# Patient Record
Sex: Female | Born: 1940 | Race: Black or African American | Hispanic: No | Smoking: Former smoker
Health system: Southern US, Community
[De-identification: ages and names within clinical notes are randomized; demographics above are authoritative.]

## PROBLEM LIST (undated history)

## (undated) DIAGNOSIS — I10 Essential (primary) hypertension: Secondary | ICD-10-CM

## (undated) HISTORY — PX: ABDOMINAL HYSTERECTOMY: SHX81

---

## 2021-02-21 ENCOUNTER — Other Ambulatory Visit: Payer: Self-pay | Admitting: Internal Medicine

## 2021-02-21 DIAGNOSIS — Z1231 Encounter for screening mammogram for malignant neoplasm of breast: Secondary | ICD-10-CM

## 2021-03-04 ENCOUNTER — Inpatient Hospital Stay
Admission: RE | Admit: 2021-03-04 | Discharge: 2021-03-04 | Disposition: A | Discharge status: Home / Self Care | Payer: Self-pay | Source: Ambulatory Visit | Attending: *Deleted | Admitting: *Deleted

## 2021-03-04 ENCOUNTER — Other Ambulatory Visit: Payer: Self-pay

## 2021-03-04 ENCOUNTER — Ambulatory Visit
Admission: RE | Admit: 2021-03-04 | Discharge: 2021-03-04 | Disposition: A | Discharge status: Home / Self Care | Payer: Medicare HMO | Source: Ambulatory Visit | Attending: Internal Medicine | Admitting: Internal Medicine

## 2021-03-04 ENCOUNTER — Other Ambulatory Visit: Payer: Self-pay | Admitting: *Deleted

## 2021-03-04 DIAGNOSIS — Z1231 Encounter for screening mammogram for malignant neoplasm of breast: Secondary | ICD-10-CM | POA: Insufficient documentation

## 2021-03-09 ENCOUNTER — Other Ambulatory Visit: Payer: Self-pay | Admitting: Internal Medicine

## 2021-03-09 DIAGNOSIS — R928 Other abnormal and inconclusive findings on diagnostic imaging of breast: Secondary | ICD-10-CM

## 2021-03-09 DIAGNOSIS — N6310 Unspecified lump in the right breast, unspecified quadrant: Secondary | ICD-10-CM

## 2021-03-14 ENCOUNTER — Other Ambulatory Visit: Payer: Self-pay

## 2021-03-14 ENCOUNTER — Ambulatory Visit
Admission: RE | Admit: 2021-03-14 | Discharge: 2021-03-14 | Disposition: A | Discharge status: Home / Self Care | Payer: Medicare HMO | Source: Ambulatory Visit | Attending: Internal Medicine | Admitting: Internal Medicine

## 2021-03-14 DIAGNOSIS — R928 Other abnormal and inconclusive findings on diagnostic imaging of breast: Secondary | ICD-10-CM | POA: Diagnosis present

## 2021-03-14 DIAGNOSIS — N6310 Unspecified lump in the right breast, unspecified quadrant: Secondary | ICD-10-CM | POA: Insufficient documentation

## 2021-04-06 ENCOUNTER — Telehealth: Payer: Self-pay | Admitting: Acute Care

## 2021-04-11 ENCOUNTER — Telehealth: Payer: Self-pay | Admitting: Acute Care

## 2021-04-11 DIAGNOSIS — F17210 Nicotine dependence, cigarettes, uncomplicated: Secondary | ICD-10-CM

## 2021-04-11 DIAGNOSIS — Z87891 Personal history of nicotine dependence: Secondary | ICD-10-CM

## 2021-04-11 NOTE — Telephone Encounter (Signed)
Spoke with Junious Dresser at Dr Prudencio Pair office. I advised her that I will call pt to discuss lung cancer screening. I also advised that Medicare may block this due to pt's current age of 39. I told her I would call her back and let her know if I have trouble getting pt scheduled. Will close this message and refer to referral notes.

## 2021-04-12 NOTE — Telephone Encounter (Signed)
Spoke with patient Scheduled [facility] 05/11/21 10:30 CT order was placed Pt voiced understanding and had no further questions.

## 2021-05-11 ENCOUNTER — Encounter: Payer: Medicare HMO | Admitting: Acute Care

## 2021-05-11 ENCOUNTER — Ambulatory Visit: Payer: Medicare HMO

## 2022-03-09 ENCOUNTER — Other Ambulatory Visit: Payer: Self-pay | Admitting: Internal Medicine

## 2022-03-09 DIAGNOSIS — Z1231 Encounter for screening mammogram for malignant neoplasm of breast: Secondary | ICD-10-CM

## 2022-04-06 ENCOUNTER — Ambulatory Visit
Admission: RE | Admit: 2022-04-06 | Discharge: 2022-04-06 | Disposition: A | Discharge status: Home / Self Care | Payer: Medicare HMO | Source: Ambulatory Visit | Attending: Internal Medicine | Admitting: Internal Medicine

## 2022-04-06 DIAGNOSIS — Z1231 Encounter for screening mammogram for malignant neoplasm of breast: Secondary | ICD-10-CM | POA: Diagnosis not present

## 2022-04-29 ENCOUNTER — Other Ambulatory Visit: Payer: Self-pay

## 2022-04-29 ENCOUNTER — Emergency Department
Admission: EM | Admit: 2022-04-29 | Discharge: 2022-04-29 | Disposition: A | Discharge status: Home / Self Care | Payer: Medicare HMO | Attending: Emergency Medicine | Admitting: Emergency Medicine

## 2022-04-29 DIAGNOSIS — M545 Low back pain, unspecified: Secondary | ICD-10-CM | POA: Diagnosis present

## 2022-04-29 DIAGNOSIS — G8929 Other chronic pain: Secondary | ICD-10-CM

## 2022-04-29 HISTORY — DX: Essential (primary) hypertension: I10

## 2022-04-29 MED ORDER — METHOCARBAMOL 500 MG PO TABS
750.0000 mg | ORAL_TABLET | Freq: Once | ORAL | Status: AC
  Administered 2022-04-29: 750 mg via ORAL
  Filled 2022-04-29: qty 2

## 2022-04-29 MED ORDER — METHOCARBAMOL 750 MG PO TABS: 750.0000 mg | ORAL_TABLET | Freq: Three times a day (TID) | ORAL | 0 refills | Status: AC

## 2022-04-29 MED ORDER — KETOROLAC TROMETHAMINE 15 MG/ML IJ SOLN
15.0000 mg | Freq: Once | INTRAMUSCULAR | Status: AC
  Administered 2022-04-29: 15 mg via INTRAMUSCULAR
  Filled 2022-04-29: qty 1

## 2022-04-29 MED ORDER — ACETAMINOPHEN 325 MG PO TABS
650.0000 mg | ORAL_TABLET | Freq: Once | ORAL | Status: AC
  Administered 2022-04-29: 650 mg via ORAL
  Filled 2022-04-29: qty 2

## 2022-04-29 MED ORDER — LIDOCAINE 5 % EX PTCH
1.0000 | MEDICATED_PATCH | CUTANEOUS | Status: DC
  Administered 2022-04-29: 1 via TRANSDERMAL
  Filled 2022-04-29: qty 1

## 2022-04-29 MED ORDER — LIDOCAINE 5 % EX PTCH: 1.0000 | MEDICATED_PATCH | CUTANEOUS | 0 refills | Status: AC

## 2022-04-29 NOTE — ED Triage Notes (Signed)
Pt presents to ED with c/o of low back pain. Pt denies injury or trauma. Pt states HX of lower back issues. Pt denies any usual loss of bowel or bladder.   Pt states she used to get cortisone shots for low back pain.

## 2022-04-29 NOTE — ED Provider Notes (Signed)
[facility] Provider Note    Event Date/Time   First MD Initiated Contact with Patient 04/29/22 1321     (approximate)   History   Back Pain   HPI  Monique Franco Croucher is a 81 y.o. female   This is a patient who is a pleasant 81 year old female who presents with her daughter, patient has a history of chronic back pain on the left lower side and will get occasional steroid injections for pain control.  She has moved from [place] and has not had an injection for quite some time.  Her pain is the same as it always has been it is acting up in the left side.  No trauma.  No incontinence or motor or sensory deficits.  No radiation of pain.  No abdominal pain.  Otherwise been in her regular state of health. History was obtained via patient and her daughter who is at bedside      Physical Exam   Triage Vital Signs: ED Triage Vitals  Enc Vitals Group     BP 04/29/22 1132 (!) 154/80     Pulse Rate 04/29/22 1132 (!) 55     Resp 04/29/22 1132 18     Temp 04/29/22 1132 98.6 F (37 C)     Temp Source 04/29/22 1132 Oral     SpO2 04/29/22 1132 100 %     Weight --      Height --      Head Circumference --      Peak Flow --      Pain Score 04/29/22 1133 10     Pain Loc --      Pain Edu? --      Excl. in GC? --     Most recent vital signs: Vitals:   04/29/22 1132 04/29/22 1544  BP: (!) 154/80 (!) 150/75  Pulse: (!) 55 60  Resp: 18 18  Temp: 98.6 F (37 C) 98.5 F (36.9 C)  SpO2: 100% 97%    General: Awake, no distress.  CV:  Good peripheral perfusion.  Resp:  Normal effort.  Abd:  No distention.  Soft and nontender Other:  Mild tenderness to palpation to the left lower back paraspinal area.  Motor or sensory intact to lower extremities bilaterally.   ED Results / Procedures / Treatments    PROCEDURES:  Critical Care performed: No  Procedures   MEDICATIONS ORDERED IN ED: Medications  lidocaine (LIDODERM) 5 % 1 patch (1 patch  Transdermal Patch Applied 04/29/22 1448)  ketorolac (TORADOL) 15 MG/ML injection 15 mg (15 mg Intramuscular Given 04/29/22 1447)  acetaminophen (TYLENOL) tablet 650 mg (650 mg Oral Given 04/29/22 1447)  methocarbamol (ROBAXIN) tablet 750 mg (750 mg Oral Given 04/29/22 1448)     IMPRESSION / MDM / ASSESSMENT AND PLAN / ED COURSE  I reviewed the triage vital signs and the nursing notes.                              Differential diagnosis includes, but is not limited to, chronic back pain, muscle strain, considered but I think is less likely emergent pathology like spinal cord compression no red flag symptoms, fracture or dislocation given no trauma, kidney stones given no history of the above and symptoms consistent with known back pain.  In shared decision-making with the patient and her daughter, we will defer imaging given low likelihood of fracture dislocation or other acute emergent pathology.  Plan for pain control and referral to PMD and possible Ortho for ongoing injections given this patient has [place].  Dispo: After careful consideration of this patient's presentation, medical and social risk factors, and evaluation in the emergency department I engaged in shared decision making with the patient and/or their representative to consider admission or observation and this patient was ultimately discharged because pt has good social support and her daughter who lives nearby, this is an exacerbation of chronic problem, and she is in stable condition with plan for follow-up with PMD and pain control as above..   Patient's presentation is most consistent with exacerbation of chronic illness.       FINAL CLINICAL IMPRESSION(S) / ED DIAGNOSES   Final diagnoses:  Chronic left-sided low back pain without sciatica     Rx / DC Orders   ED Discharge Orders          Ordered    methocarbamol (ROBAXIN-750) 750 MG tablet  3 times daily        04/29/22 1503    lidocaine  (LIDODERM) 5 %  Every 24 hours        04/29/22 1503             Note:  This document was prepared using Dragon voice recognition software and may include unintentional dictation errors.    Pilar Jarvis, MD 04/29/22 1946

## 2022-04-29 NOTE — Discharge Instructions (Signed)
Take acetaminophen 650 mg and ibuprofen 400 mg every 6 hours for pain.  Take with food. Take robaxin as prescribed. Be careful as this may make you sleepy.  Use pain patches daily.    Thank you for choosing [place] for your health care today!  Please see your primary doctor this week for a follow up appointment.   Sometimes, in the early stages of certain disease courses it is difficult to detect in the emergency department evaluation -- so, it is important that you continue to monitor your symptoms and call your doctor right away or return to the emergency department if you develop any new or worsening symptoms.  It was my pleasure to care for you today.   Daneil Dan Modesto Charon, MD

## 2022-08-09 ENCOUNTER — Other Ambulatory Visit: Payer: Self-pay | Admitting: Family Medicine

## 2022-08-09 DIAGNOSIS — M5416 Radiculopathy, lumbar region: Secondary | ICD-10-CM

## 2022-08-29 ENCOUNTER — Ambulatory Visit
Admission: RE | Admit: 2022-08-29 | Discharge: 2022-08-29 | Disposition: A | Discharge status: Home / Self Care | Payer: Medicare HMO | Source: Ambulatory Visit | Attending: Family Medicine | Admitting: Family Medicine

## 2022-08-29 DIAGNOSIS — M5416 Radiculopathy, lumbar region: Secondary | ICD-10-CM

## 2022-08-31 ENCOUNTER — Ambulatory Visit
Admission: RE | Admit: 2022-08-31 | Discharge: 2022-08-31 | Disposition: A | Discharge status: Home / Self Care | Payer: Medicare HMO | Source: Ambulatory Visit | Attending: Physical Medicine and Rehabilitation | Admitting: Physical Medicine and Rehabilitation

## 2022-08-31 ENCOUNTER — Other Ambulatory Visit: Payer: Self-pay | Admitting: Physical Medicine and Rehabilitation

## 2022-08-31 DIAGNOSIS — R1900 Intra-abdominal and pelvic swelling, mass and lump, unspecified site: Secondary | ICD-10-CM

## 2022-08-31 MED ORDER — IOHEXOL 300 MG/ML  SOLN
100.0000 mL | Freq: Once | INTRAMUSCULAR | Status: AC | PRN
  Administered 2022-08-31: 100 mL via INTRAVENOUS

## 2022-09-06 ENCOUNTER — Other Ambulatory Visit: Payer: Self-pay | Admitting: Family Medicine

## 2022-09-06 DIAGNOSIS — N9489 Other specified conditions associated with female genital organs and menstrual cycle: Secondary | ICD-10-CM

## 2022-09-06 DIAGNOSIS — R1900 Intra-abdominal and pelvic swelling, mass and lump, unspecified site: Secondary | ICD-10-CM

## 2022-09-07 ENCOUNTER — Ambulatory Visit
Admission: RE | Admit: 2022-09-07 | Discharge: 2022-09-07 | Disposition: A | Discharge status: Home / Self Care | Payer: Medicare HMO | Source: Ambulatory Visit | Attending: Family Medicine | Admitting: Family Medicine

## 2022-09-07 DIAGNOSIS — R1900 Intra-abdominal and pelvic swelling, mass and lump, unspecified site: Secondary | ICD-10-CM | POA: Diagnosis present

## 2022-09-07 DIAGNOSIS — N9489 Other specified conditions associated with female genital organs and menstrual cycle: Secondary | ICD-10-CM | POA: Diagnosis present

## 2022-09-07 MED ORDER — GADOBUTROL 1 MMOL/ML IV SOLN
7.0000 mL | Freq: Once | INTRAVENOUS | Status: AC | PRN
  Administered 2022-09-07: 7 mL via INTRAVENOUS

## 2022-10-09 ENCOUNTER — Other Ambulatory Visit (INDEPENDENT_AMBULATORY_CARE_PROVIDER_SITE_OTHER): Payer: Self-pay | Admitting: Podiatry

## 2022-10-09 ENCOUNTER — Ambulatory Visit (INDEPENDENT_AMBULATORY_CARE_PROVIDER_SITE_OTHER): Payer: Medicare HMO

## 2022-10-09 DIAGNOSIS — I739 Peripheral vascular disease, unspecified: Secondary | ICD-10-CM

## 2022-10-09 LAB — VAS US ABI WITH/WO TBI
Left ABI: 0.4
Right ABI: 0.58

## 2022-10-30 ENCOUNTER — Encounter (INDEPENDENT_AMBULATORY_CARE_PROVIDER_SITE_OTHER): Payer: Self-pay | Admitting: Vascular Surgery

## 2022-10-30 ENCOUNTER — Ambulatory Visit (INDEPENDENT_AMBULATORY_CARE_PROVIDER_SITE_OTHER): Payer: Medicare HMO | Admitting: Vascular Surgery

## 2022-10-30 VITALS — BP 165/82 | HR 54 | Resp 18 | Ht 61.0 in | Wt 148.8 lb

## 2022-10-30 DIAGNOSIS — I70213 Atherosclerosis of native arteries of extremities with intermittent claudication, bilateral legs: Secondary | ICD-10-CM

## 2022-10-30 DIAGNOSIS — I10 Essential (primary) hypertension: Secondary | ICD-10-CM

## 2022-10-30 DIAGNOSIS — M159 Polyosteoarthritis, unspecified: Secondary | ICD-10-CM | POA: Diagnosis not present

## 2022-10-30 DIAGNOSIS — I70219 Atherosclerosis of native arteries of extremities with intermittent claudication, unspecified extremity: Secondary | ICD-10-CM | POA: Insufficient documentation

## 2022-10-30 DIAGNOSIS — E782 Mixed hyperlipidemia: Secondary | ICD-10-CM

## 2022-10-30 NOTE — Progress Notes (Signed)
MRN : [id number]  Monique Franco is a 82 y.o. ([birth year]) female who presents with chief complaint of check circulation.  History of Present Illness:    The patient is seen for evaluation of painful lower extremities and diminished pulses. Patient notes the pain is always associated with activity and is very consistent day today. Typically, the pain occurs at less than one block, progress is as activity continues to the point that the patient must stop walking. Resting including standing still for several minutes allows the patient to walk a similar distance before being forced to stop again. Uneven terrain and inclines shorten the distance. The pain has been progressive over the past several years. The patient denies any abrupt changes in claudication symptoms.  The patient states the inability to walk is causing problems with daily activities.  The patient denies rest pain or dangling of an extremity off the side of the bed during the night for relief. No open wounds or sores at this time. No prior interventions or surgeries.  No history of back problems or DJD of the lumbar sacral spine.   The patient's blood pressure has been stable and relatively well controlled. The patient denies amaurosis fugax or recent TIA symptoms. There are no recent neurological changes noted. The patient denies history of DVT, PE or superficial thrombophlebitis. The patient denies recent episodes of angina or shortness of breath.   ABI done here 10/09/2022 shows Rt=0.58 and Lt=0.40 monophasic signals bilaterally.  No outpatient medications have been marked as taking for the 10/30/22 encounter (Appointment) with Delana Meyer, Dolores Lory, MD.    Past Medical History:  Diagnosis Date   Hypertension     No past surgical history on file.  Social History    Family History Family History  Problem Relation Age of Onset   Breast cancer Neg Hx     No Known Allergies   REVIEW OF SYSTEMS  (Negative unless checked)  Constitutional: '[]'$ Weight loss  '[]'$ Fever  '[]'$ Chills Cardiac: '[]'$ Chest pain   '[]'$ Chest pressure   '[]'$ Palpitations   '[]'$ Shortness of breath when laying flat   '[]'$ Shortness of breath with exertion. Vascular:  '[x]'$ Pain in legs with walking   '[]'$ Pain in legs at rest  '[]'$ History of DVT   '[]'$ Phlebitis   '[]'$ Swelling in legs   '[]'$ Varicose veins   '[]'$ Non-healing ulcers Pulmonary:   '[]'$ Uses home oxygen   '[]'$ Productive cough   '[]'$ Hemoptysis   '[]'$ Wheeze  '[]'$ COPD   '[]'$ Asthma Neurologic:  '[]'$ Dizziness   '[]'$ Seizures   '[]'$ History of stroke   '[]'$ History of TIA  '[]'$ Aphasia   '[]'$ Vissual changes   '[]'$ Weakness or numbness in arm   '[]'$ Weakness or numbness in leg Musculoskeletal:   '[]'$ Joint swelling   '[]'$ Joint pain   '[]'$ Low back pain Hematologic:  '[]'$ Easy bruising  '[]'$ Easy bleeding   '[]'$ Hypercoagulable state   '[]'$ Anemic Gastrointestinal:  '[]'$ Diarrhea   '[]'$ Vomiting  '[]'$ Gastroesophageal reflux/heartburn   '[]'$ Difficulty swallowing. Genitourinary:  '[]'$ Chronic kidney disease   '[]'$ Difficult urination  '[]'$ Frequent urination   '[]'$ Blood in urine Skin:  '[]'$ Rashes   '[]'$ Ulcers  Psychological:  '[]'$ History of anxiety   '[]'$  History of major depression.  Physical Examination  There were no vitals filed for this visit. There is no height or weight on file to calculate BMI. Gen: WD/WN, NAD Head: [place]/AT, No temporalis wasting.  Ear/Nose/Throat: Hearing grossly intact, nares w/o erythema or drainage Eyes: PER, EOMI, sclera nonicteric.  Neck: Supple, no masses.  No bruit  or JVD.  Pulmonary:  Good air movement, no audible wheezing, no use of accessory muscles.  Cardiac: RRR, normal S1, S2, no Murmurs. Vascular:  mild trophic changes, no open wounds Vessel Right Left  Radial Palpable Palpable  PT Not Palpable Not Palpable  DP Not Palpable Not Palpable  Gastrointestinal: soft, non-distended. No guarding/no peritoneal signs.  Musculoskeletal: M/S 5/5 throughout.  No visible deformity.  Neurologic: CN 2-12 intact. Pain and light touch intact in  extremities.  Symmetrical.  Speech is fluent. Motor exam as listed above. Psychiatric: Judgment intact, Mood & affect appropriate for pt's clinical situation. Dermatologic: No rashes or ulcers noted.  No changes consistent with cellulitis.   CBC No results found for: "WBC", "HGB", "HCT", "MCV", "PLT"  BMET No results found for: "NA", "K", "CL", "CO2", "GLUCOSE", "BUN", "CREATININE", "CALCIUM", "GFRNONAA", "GFRAA" CrCl cannot be calculated (No successful lab value found.).  COAG No results found for: "INR", "PROTIME"  Radiology VAS [place] ABI WITH/WO TBI  Result Date: 10/09/2022  LOWER EXTREMITY DOPPLER STUDY Patient Name:  Monique Franco  Date of Exam:   10/09/2022 Medical Rec #: [id number]           Accession #:    [id number] Date of Birth: 1941/06/27            Patient Gender: F Patient Age:   54 years Exam Location:  [facility] Procedure:      VAS [place] ABI WITH/WO TBI Referring Phys: Caroline More --------------------------------------------------------------------------------  Indications: Decreased pulses felt at the level of the Ankle.  Performing Technologist: Almira Coaster RVS  Examination Guidelines: A complete evaluation includes at minimum, Doppler waveform signals and systolic blood pressure reading at the level of bilateral brachial, anterior tibial, and posterior tibial arteries, when vessel segments are accessible. Bilateral testing is considered an integral part of a complete examination. Photoelectric Plethysmograph (PPG) waveforms and toe systolic pressure readings are included as required and additional duplex testing as needed. Limited examinations for reoccurring indications may be performed as noted.  ABI Findings: +---------+------------------+-----+-------------------+---------------+ Right    Rt Pressure (mmHg)IndexWaveform           Comment         +---------+------------------+-----+-------------------+---------------+ Brachial 171                                                        +---------+------------------+-----+-------------------+---------------+ ATA      93                0.54 monophasic         Collateral Flow +---------+------------------+-----+-------------------+---------------+ PTA      99                0.58 dampened monophasic                +---------+------------------+-----+-------------------+---------------+ Great Toe53                0.31 Abnormal                           +---------+------------------+-----+-------------------+---------------+ +---------+------------------+-----+----------+---------------+ Left     Lt Pressure (mmHg)IndexWaveform  Comment         +---------+------------------+-----+----------+---------------+ Brachial 170                                              +---------+------------------+-----+----------+---------------+  ATA      68                0.40 monophasicCollateral Flow +---------+------------------+-----+----------+---------------+ PTA      69                0.40 monophasicCollateral Flow +---------+------------------+-----+----------+---------------+ Great Toe87                0.51 Abnormal                  +---------+------------------+-----+----------+---------------+ +-------+-----------+-----------+------------+------------+ ABI/TBIToday's ABIToday's TBIPrevious ABIPrevious TBI +-------+-----------+-----------+------------+------------+ Right  .58        .31                                 +-------+-----------+-----------+------------+------------+ Left   .40        .51                                 +-------+-----------+-----------+------------+------------+  Summary: Right: Resting right ankle-brachial index indicates moderate right lower extremity arterial disease. The right toe-brachial index is abnormal. Imaging and Waveforms obtained of the Distal Posterior Tibial, Anterior Tibial and Peroneal Artery. The Peroneal appears to be  the Dominant Artery, collateral flow seen in the PTA and ATA. Left: Resting left ankle-brachial index indicates severe left lower extremity arterial disease. The left toe-brachial index is abnormal. Imaging and Waveforms obtained of the Distal Posterior Tibial, Anterior Tibial and Peroneal Artery. The Peroneal appears to be the Dominant Artery, collateral flow seen in the PTA and ATA.  *See table(s) above for measurements and observations.   Electronically signed by Hortencia Pilar MD on 10/09/2022 at 3:47:31 PM.    Final      Assessment/Plan 1. Atherosclerosis of native artery of both lower extremities with intermittent claudication (HCC)  Recommend:  The patient has evidence of atherosclerosis of the lower extremities with claudication.  The patient does not voice lifestyle limiting changes at this point in time.  Noninvasive studies do not suggest clinically significant change.  No invasive studies, angiography or surgery at this time The patient should continue walking and begin a more formal exercise program.  The patient should continue antiplatelet therapy and aggressive treatment of the lipid abnormalities  No changes in the patient's medications at this time  Continued surveillance is indicated as atherosclerosis is likely to progress with time.    The patient will continue follow up with noninvasive studies as ordered.  - VAS [place] ABI WITH/WO TBI; Future  2. Essential hypertension Continue antihypertensive medications as already ordered, these medications have been reviewed and there are no changes at this time.  3. Mixed hyperlipidemia Continue statin as ordered and reviewed, no changes at this time  4. Primary osteoarthritis involving multiple joints Continue NSAID medications as already ordered, these medications have been reviewed and there are no changes at this time.  Continued activity and therapy was stressed.    Hortencia Pilar, MD  10/30/2022 12:47 PM

## 2022-11-04 ENCOUNTER — Encounter (INDEPENDENT_AMBULATORY_CARE_PROVIDER_SITE_OTHER): Payer: Self-pay | Admitting: Vascular Surgery

## 2022-11-04 DIAGNOSIS — I10 Essential (primary) hypertension: Secondary | ICD-10-CM | POA: Insufficient documentation

## 2022-11-04 DIAGNOSIS — M1990 Unspecified osteoarthritis, unspecified site: Secondary | ICD-10-CM | POA: Insufficient documentation

## 2022-11-04 DIAGNOSIS — E785 Hyperlipidemia, unspecified: Secondary | ICD-10-CM | POA: Insufficient documentation

## 2023-03-03 IMAGING — MG MM DIGITAL DIAGNOSTIC UNILAT*R* W/ TOMO W/ CAD
6 series · 6 of 18 positions shown · non-contrast
Comparison: Previous exam(s).

CLINICAL DATA: Possible mass in the outer retroareolar right breast
on a recent screening mammogram.

EXAM:
DIGITAL DIAGNOSTIC UNILATERAL RIGHT MAMMOGRAM WITH TOMOSYNTHESIS AND
CAD; ULTRASOUND RIGHT BREAST LIMITED
TECHNIQUE: Right digital diagnostic mammography and breast tomosynthesis was
performed. The images were evaluated with computer-aided detection.;
Targeted ultrasound examination of the right breast was performed

[R CC synth-2D]
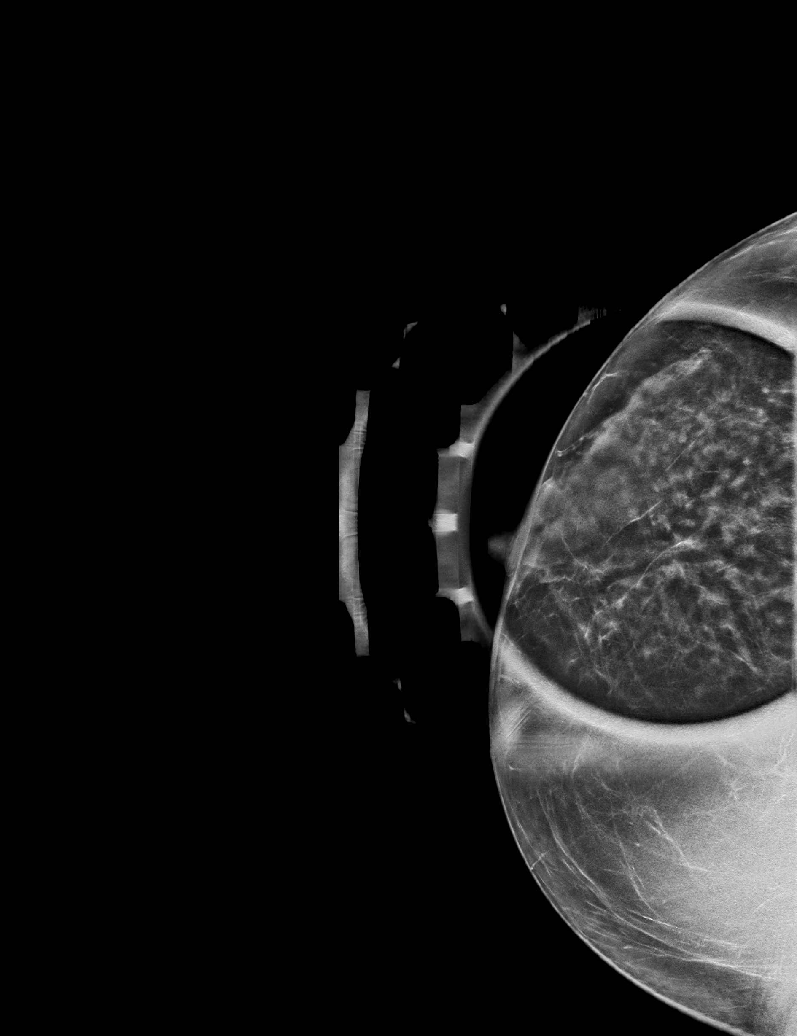

[R MLO synth-2D]
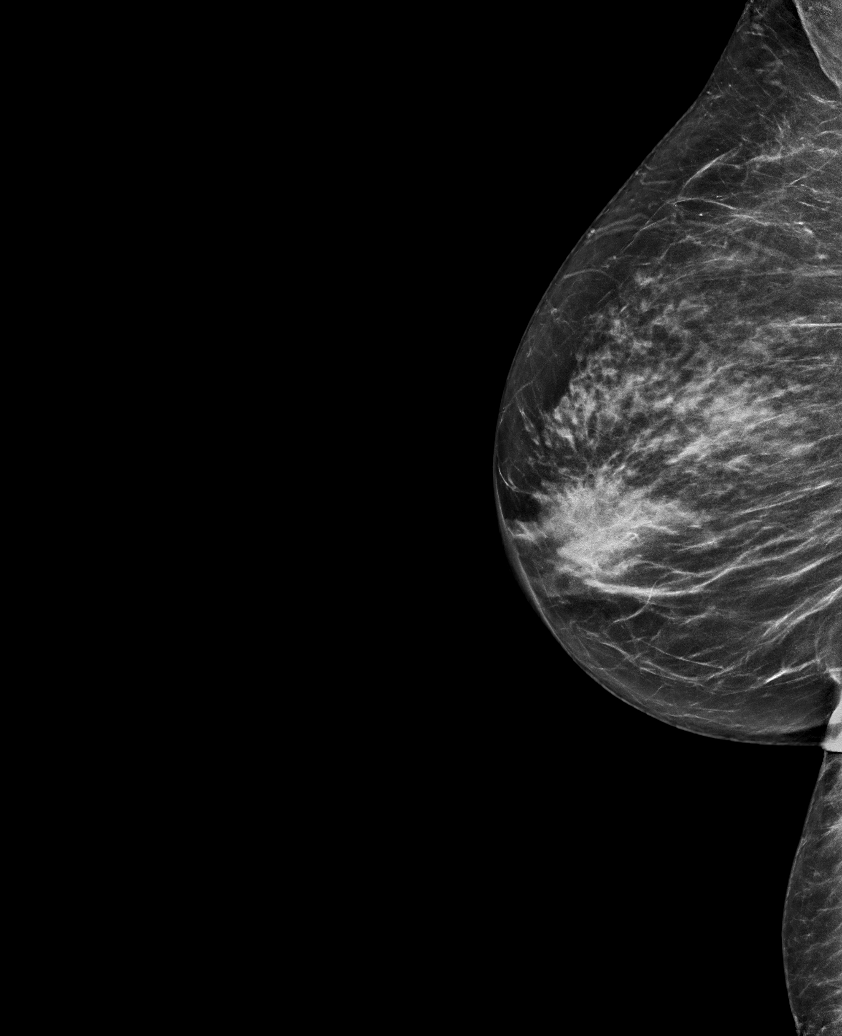

[R ML synth-2D]
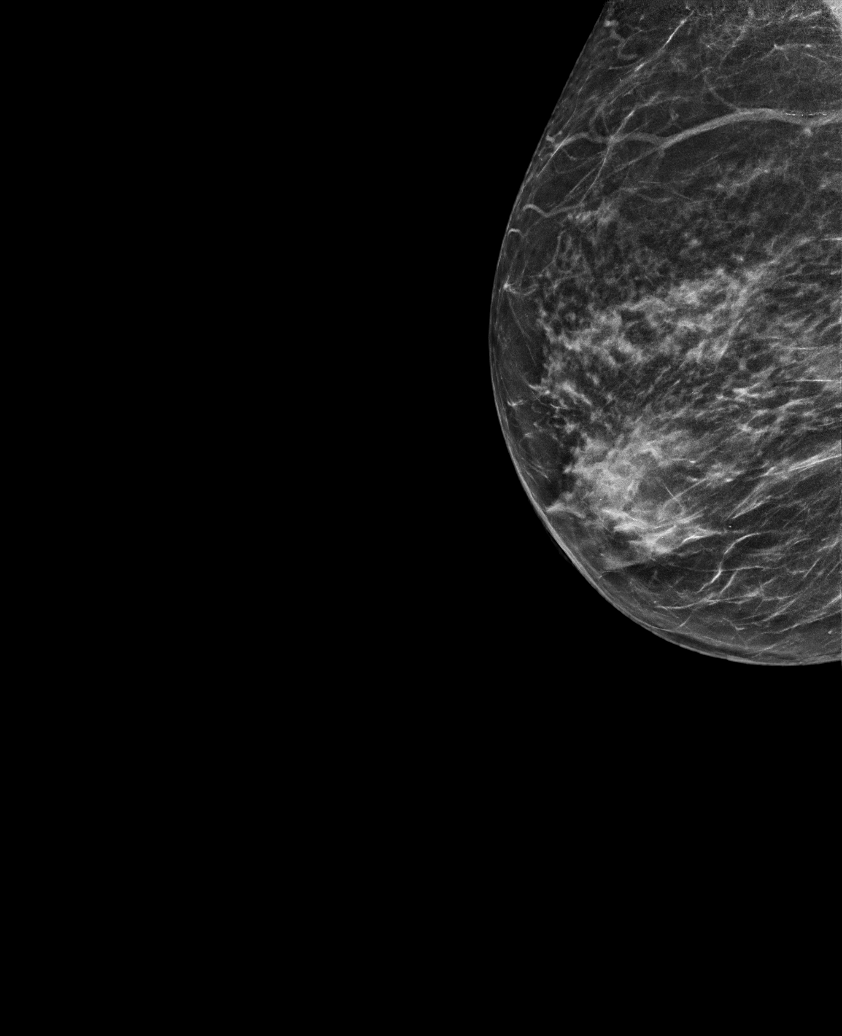

[R MLO tomo · tomo slice 33/64.0]
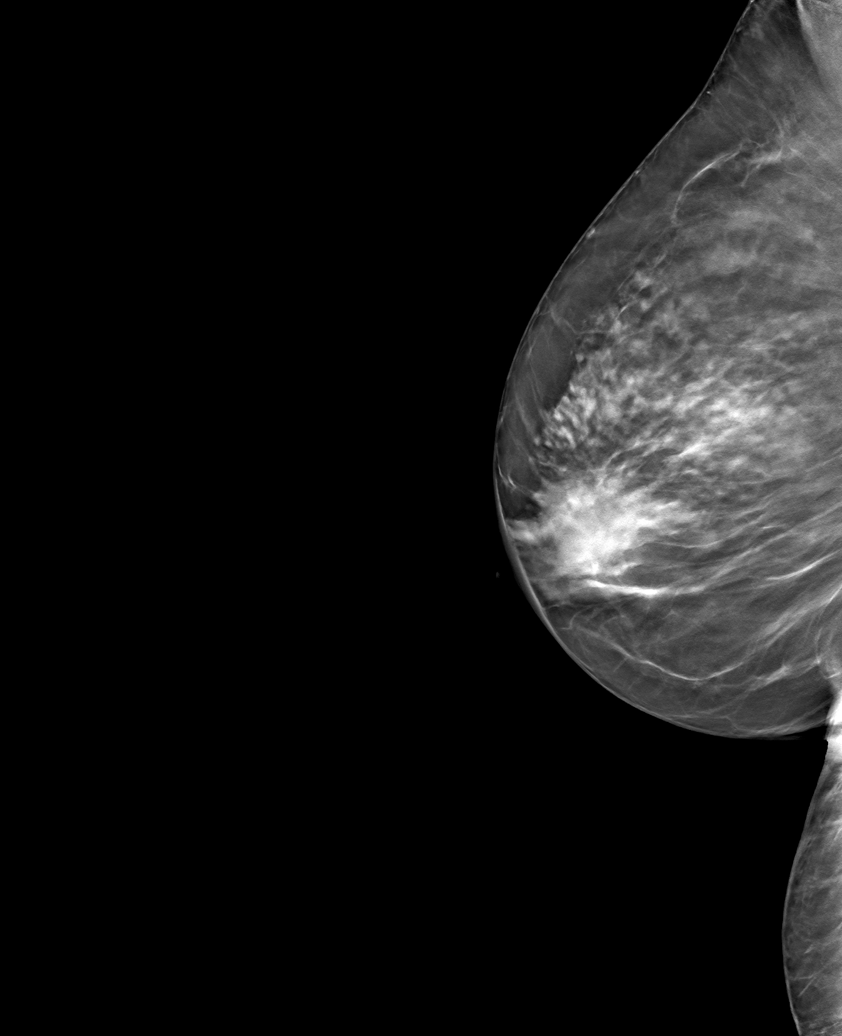

[R CC tomo · tomo slice 28/55.0]
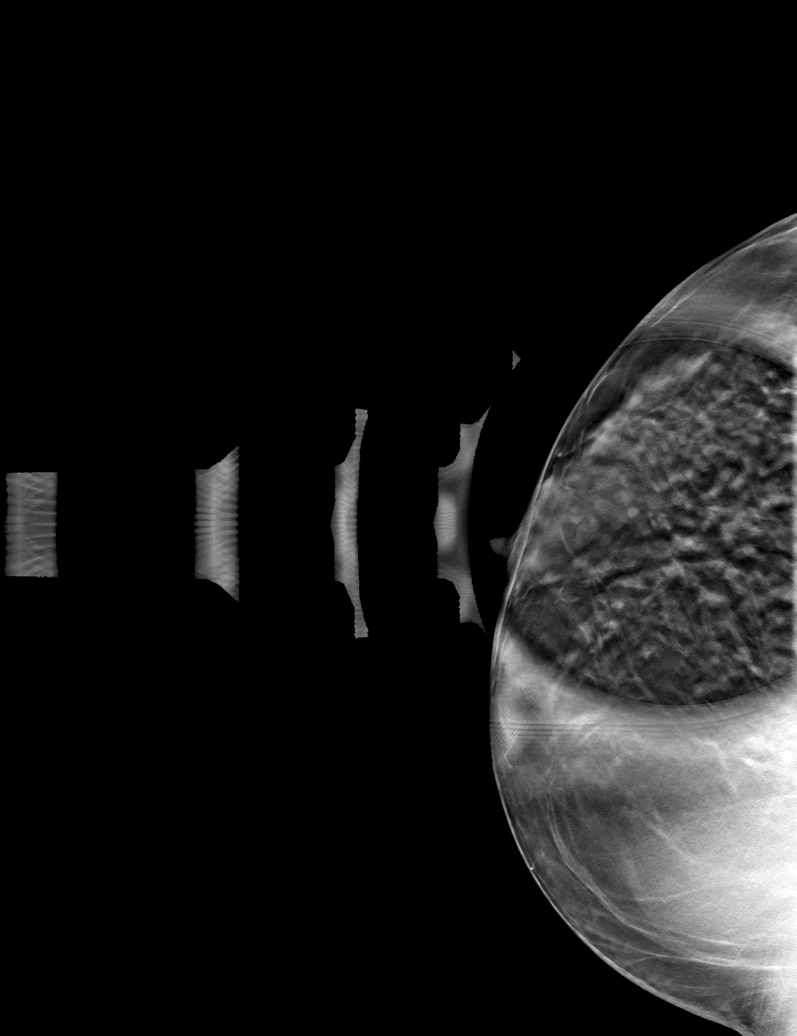

[R ML tomo · tomo slice 33/66.0]
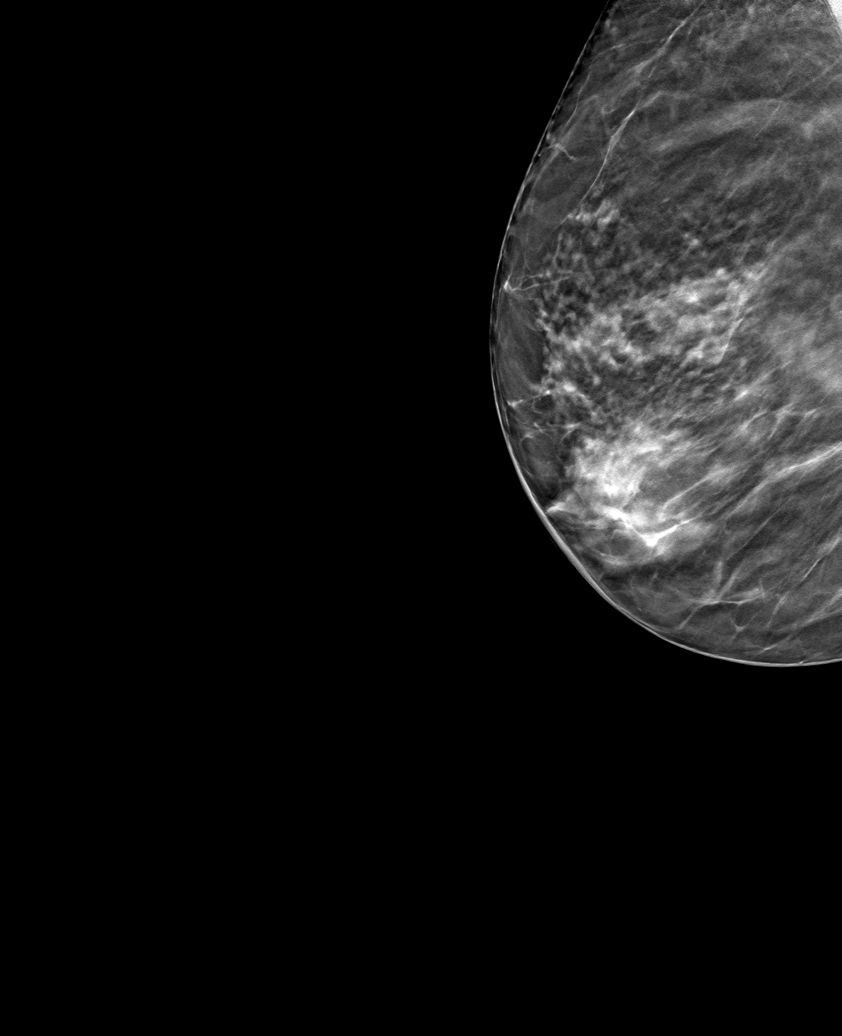

[6 of 18 positions shown; findings below may reference images not displayed]

ACR Breast Density Category c: The breast tissue is heterogeneously
dense, which may obscure small masses.
FINDINGS: 3D tomographic and 2D generated right MLO, ML and spot compression
craniocaudal images of the right breast were obtained. There is a
persistent oval, circumscribed mass-like area in the lateral
retroareolar right breast in the craniocaudal projection. No
corresponding abnormality in the oblique or true lateral projection.

Targeted ultrasound is performed, showing normal appearing breast
tissue and mildly prominent retroareolar ducts in the outer
retroareolar right breast. No mass was seen.
IMPRESSION: No evidence of malignancy. The recently suspected right breast mass
was close apposition of normal breast tissue and prominent
retroareolar ducts.

RECOMMENDATION:
Bilateral screening mammogram in 1 year when due.

I have discussed the findings and recommendations with the patient.
If applicable, a reminder letter will be sent to the patient
regarding the next appointment.

BI-RADS CATEGORY  1: Negative.

## 2023-03-20 ENCOUNTER — Other Ambulatory Visit: Payer: Self-pay | Admitting: Internal Medicine

## 2023-03-20 DIAGNOSIS — Z1231 Encounter for screening mammogram for malignant neoplasm of breast: Secondary | ICD-10-CM

## 2023-04-10 ENCOUNTER — Ambulatory Visit
Admission: RE | Admit: 2023-04-10 | Discharge: 2023-04-10 | Disposition: A | Discharge status: Home / Self Care | Payer: Medicare HMO | Source: Ambulatory Visit | Attending: Internal Medicine | Admitting: Internal Medicine

## 2023-04-10 DIAGNOSIS — Z1231 Encounter for screening mammogram for malignant neoplasm of breast: Secondary | ICD-10-CM | POA: Diagnosis not present

## 2023-05-01 ENCOUNTER — Other Ambulatory Visit (INDEPENDENT_AMBULATORY_CARE_PROVIDER_SITE_OTHER): Payer: Self-pay | Admitting: Vascular Surgery

## 2023-05-01 DIAGNOSIS — I70213 Atherosclerosis of native arteries of extremities with intermittent claudication, bilateral legs: Secondary | ICD-10-CM

## 2023-05-02 ENCOUNTER — Ambulatory Visit (INDEPENDENT_AMBULATORY_CARE_PROVIDER_SITE_OTHER): Payer: Medicare HMO

## 2023-05-02 ENCOUNTER — Ambulatory Visit (INDEPENDENT_AMBULATORY_CARE_PROVIDER_SITE_OTHER): Payer: Medicare HMO | Admitting: Nurse Practitioner

## 2023-05-02 ENCOUNTER — Encounter (INDEPENDENT_AMBULATORY_CARE_PROVIDER_SITE_OTHER): Payer: Self-pay | Admitting: Nurse Practitioner

## 2023-05-02 VITALS — BP 148/78 | HR 60 | Resp 16 | Wt 148.2 lb

## 2023-05-02 DIAGNOSIS — I70213 Atherosclerosis of native arteries of extremities with intermittent claudication, bilateral legs: Secondary | ICD-10-CM

## 2023-05-02 DIAGNOSIS — M159 Polyosteoarthritis, unspecified: Secondary | ICD-10-CM

## 2023-05-02 DIAGNOSIS — E782 Mixed hyperlipidemia: Secondary | ICD-10-CM | POA: Diagnosis not present

## 2023-05-02 DIAGNOSIS — I10 Essential (primary) hypertension: Secondary | ICD-10-CM

## 2023-05-02 NOTE — Progress Notes (Signed)
Subjective:    Patient ID: Monique Franco, female    DOB: 15-Oct-1940, 82 y.o.   MRN: [id number] Chief Complaint  Patient presents with   Follow-up    Ultrasound follow up    The patient returns to the office for followup and review of the noninvasive studies.   There have been no interval changes in lower extremity symptoms. No interval shortening of the patient's claudication distance or development of rest pain symptoms. No new ulcers or wounds have occurred since the last visit.  She notes that she has significant pain in the right leg but following a recent steroid injection to her knee that pain has resolved  There have been no significant changes to the patient's overall health care.  The patient denies amaurosis fugax or recent TIA symptoms. There are no documented recent neurological changes noted. There is no history of DVT, PE or superficial thrombophlebitis. The patient denies recent episodes of angina or shortness of breath.   ABI Rt=0.90 and Lt=0.67  (previous ABI's Rt=0.58 and Lt=0.40) Duplex ultrasound of the right lower extremity shows triphasic waveforms to the level of the mid SFA where transitioned to monophasic waveforms.  The patient has elevated velocities in the proximal SFA likely indicative of a greater than 50% stenosis.  The patient has primarily monophasic waveforms extending in the proximal SFA throughout the left lower extremity.  She has absent posterior tibial anterior tibial artery waveforms.  Has one-vessel runoff through the peroneal to the foot.    Review of Systems  Musculoskeletal:  Positive for arthralgias and gait problem.  All other systems reviewed and are negative.      Objective:   Physical Exam Vitals reviewed.  HENT:     Head: Normocephalic.  Cardiovascular:     Rate and Rhythm: Normal rate.     Pulses:          Dorsalis pedis pulses are detected w/ Doppler on the right side and detected w/ Doppler on the left side.        Posterior tibial pulses are detected w/ Doppler on the right side and detected w/ Doppler on the left side.  Pulmonary:     Effort: Pulmonary effort is normal.  Skin:    General: Skin is warm and dry.  Neurological:     Mental Status: She is alert and oriented to person, place, and time.  Psychiatric:        Mood and Affect: Mood normal.        Behavior: Behavior normal.        Thought Content: Thought content normal.        Judgment: Judgment normal.     BP (!) 148/78 (BP Location: Right Arm)   Pulse 60   Resp 16   Wt 148 lb 3.2 oz (67.2 kg)   BMI 28.00 kg/m   Past Medical History:  Diagnosis Date   Hypertension     Social History   Socioeconomic History   Marital status: Divorced    Spouse name: Not on file   Number of children: Not on file   Years of education: Not on file   Highest education level: Not on file  Occupational History   Not on file  Tobacco Use   Smoking status: Former    Types: Cigarettes   Smokeless tobacco: Never  Substance and Sexual Activity   Alcohol use: Never   Drug use: Never   Sexual activity: Not Currently  Other Topics Concern   Not  on file  Social History Narrative   Not on file   Social Determinants of Health   Financial Resource Strain: Low Risk  (04/13/2023)   Received from [facility]   Overall Financial Resource Strain (CARDIA)    Difficulty of Paying Living Expenses: Not hard at all  Food Insecurity: No Food Insecurity (04/13/2023)   Received from [facility]   Hunger Vital Sign    Worried About Running Out of Food in the Last Year: Never true    Ran Out of Food in the Last Year: Never true  Transportation Needs: No Transportation Needs (04/13/2023)   Received from [facility] - Transportation    In the past 12 months, has lack of transportation kept you from medical appointments or from getting medications?: No    Lack of Transportation (Non-Medical): No   Physical Activity: Not on file  Stress: Not on file  Social Connections: Not on file  Intimate Partner Violence: Not on file    Past Surgical History:  Procedure Laterality Date   ABDOMINAL HYSTERECTOMY      Family History  Problem Relation Age of Onset   Hypertension Brother    Breast cancer Neg Hx     No Known Allergies      No data to display            CMP  No results found for: "NA", "K", "CL", "CO2", "GLUCOSE", "BUN", "CREATININE", "CALCIUM", "PROT", "ALBUMIN", "AST", "ALT", "ALKPHOS", "BILITOT", "GFR", "EGFR", "GFRNONAA"   No results found.     Assessment & Plan:   1. Atherosclerosis of native artery of both lower extremities with intermittent claudication (HCC)  Recommend:  The patient has evidence of atherosclerosis of the lower extremities with claudication.  The patient does not voice lifestyle limiting changes at this point in time.  Noninvasive studies do not suggest clinically significant change.  No invasive studies, angiography or surgery at this time The patient should continue walking and begin a more formal exercise program.  The patient should continue antiplatelet therapy and aggressive treatment of the lipid abnormalities  No changes in the patient's medications at this time  Continued surveillance is indicated as atherosclerosis is likely to progress with time.    The patient will continue follow up with noninvasive studies as ordered.   2. Primary osteoarthritis involving multiple joints The patient notes that she had significant discomfort in her legs with following recent steroid injections to her right knee, she has little discomfort.  Patient will continue to follow-up with her orthopedic provider  3. Essential hypertension Continue antihypertensive medications as already ordered, these medications have been reviewed and there are no changes at this time.  4. Mixed hyperlipidemia Continue statin as ordered and reviewed, no  changes at this time   Current Outpatient Medications on File Prior to Visit  Medication Sig Dispense Refill   amLODipine (NORVASC) 2.5 MG tablet Take 1 tablet by mouth daily.     celecoxib (CELEBREX) 200 MG capsule Take 200 mg by mouth daily.     Cholecalciferol 50 MCG (2000 UT) TABS Take by mouth.     FLOVENT HFA 110 MCG/ACT inhaler Inhale 2 puffs into the lungs 2 (two) times daily.     fluticasone-salmeterol (ADVAIR) 100-50 MCG/ACT AEPB Inhale into the lungs.     levothyroxine (SYNTHROID) 25 MCG tablet Take 25 mcg by mouth daily.     methocarbamol (ROBAXIN) 500 MG tablet 1/2-1 po qBID prn  pregabalin (LYRICA) 50 MG capsule Take 50 mg by mouth 2 (two) times daily.     simvastatin (ZOCOR) 40 MG tablet Take 40 mg by mouth at bedtime.     traMADol (ULTRAM) 50 MG tablet Take by mouth.     urea (CARMOL) 40 % CREA Apply topically 2 (two) times daily.     valsartan-hydrochlorothiazide (DIOVAN-HCT) 320-12.5 MG tablet Take 1 tablet by mouth daily.     No current facility-administered medications on file prior to visit.    There are no Patient Instructions on file for this visit. No follow-ups on file.   Georgiana Spinner, NP

## 2023-05-03 LAB — VAS US ABI WITH/WO TBI
Left ABI: 0.67
Right ABI: 0.9

## 2023-11-05 ENCOUNTER — Other Ambulatory Visit (INDEPENDENT_AMBULATORY_CARE_PROVIDER_SITE_OTHER): Payer: Self-pay | Admitting: Nurse Practitioner

## 2023-11-05 DIAGNOSIS — I70213 Atherosclerosis of native arteries of extremities with intermittent claudication, bilateral legs: Secondary | ICD-10-CM

## 2023-11-07 ENCOUNTER — Ambulatory Visit (INDEPENDENT_AMBULATORY_CARE_PROVIDER_SITE_OTHER): Payer: Medicare HMO | Admitting: Nurse Practitioner

## 2023-11-07 ENCOUNTER — Encounter (INDEPENDENT_AMBULATORY_CARE_PROVIDER_SITE_OTHER): Payer: Self-pay | Admitting: Nurse Practitioner

## 2023-11-07 ENCOUNTER — Ambulatory Visit (INDEPENDENT_AMBULATORY_CARE_PROVIDER_SITE_OTHER): Payer: Medicare HMO

## 2023-11-07 VITALS — BP 138/67 | HR 60 | Resp 16 | Wt 144.6 lb

## 2023-11-07 DIAGNOSIS — E782 Mixed hyperlipidemia: Secondary | ICD-10-CM

## 2023-11-07 DIAGNOSIS — I10 Essential (primary) hypertension: Secondary | ICD-10-CM | POA: Diagnosis not present

## 2023-11-07 DIAGNOSIS — I70213 Atherosclerosis of native arteries of extremities with intermittent claudication, bilateral legs: Secondary | ICD-10-CM | POA: Diagnosis not present

## 2023-11-08 ENCOUNTER — Encounter (INDEPENDENT_AMBULATORY_CARE_PROVIDER_SITE_OTHER): Payer: Self-pay | Admitting: Nurse Practitioner

## 2023-11-08 LAB — VAS US ABI WITH/WO TBI
Left ABI: 0.65
Right ABI: 0.73

## 2023-11-08 NOTE — Progress Notes (Signed)
 Subjective:    Patient ID: Monique Franco, female    DOB: Mar 19, 1941, 83 y.o.   MRN: [id number] Chief Complaint  Patient presents with   Follow-up    6 month abi follow up    The patient returns to the office for followup and review of the noninvasive studies.   There have been no interval changes in lower extremity symptoms. No interval shortening of the patient's claudication distance or development of rest pain symptoms. No new ulcers or wounds have occurred since the last visit.  She notes that she has significant pain in the right leg but following a recent steroid injection to her knee that pain has resolved  There have been no significant changes to the patient's overall health care.  The patient denies amaurosis fugax or recent TIA symptoms. There are no documented recent neurological changes noted. There is no history of DVT, PE or superficial thrombophlebitis. The patient denies recent episodes of angina or shortness of breath.   ABI Rt=0.73 and Lt=0.0.65  (previous ABI's Rt=0.90 and Lt=0.67) Duplex ultrasound of the bilateral lower extremity shows biphasic/monophasic waveforms in the tibial vessels    Review of Systems  Musculoskeletal:  Positive for arthralgias and gait problem.  All other systems reviewed and are negative.      Objective:   Physical Exam Vitals reviewed.  HENT:     Head: Normocephalic.  Cardiovascular:     Rate and Rhythm: Normal rate.     Pulses:          Dorsalis pedis pulses are detected w/ Doppler on the right side and detected w/ Doppler on the left side.       Posterior tibial pulses are detected w/ Doppler on the right side and detected w/ Doppler on the left side.  Pulmonary:     Effort: Pulmonary effort is normal.  Skin:    General: Skin is warm and dry.  Neurological:     Mental Status: She is alert and oriented to person, place, and time.  Psychiatric:        Mood and Affect: Mood normal.        Behavior: Behavior normal.         Thought Content: Thought content normal.        Judgment: Judgment normal.     BP 138/67   Pulse 60   Resp 16   Wt 144 lb 9.6 oz (65.6 kg)   BMI 27.32 kg/m   Past Medical History:  Diagnosis Date   Hypertension     Social History   Socioeconomic History   Marital status: Divorced    Spouse name: Not on file   Number of children: Not on file   Years of education: Not on file   Highest education level: Not on file  Occupational History   Not on file  Tobacco Use   Smoking status: Former    Types: Cigarettes   Smokeless tobacco: Never  Substance and Sexual Activity   Alcohol use: Never   Drug use: Never   Sexual activity: Not Currently  Other Topics Concern   Not on file  Social History Narrative   Not on file   Social Drivers of Health   Financial Resource Strain: Low Risk  (09/06/2023)   Received from [facility]   Overall Financial Resource Strain (CARDIA)    Difficulty of Paying Living Expenses: Not hard at all  Food Insecurity: No Food Insecurity (09/06/2023)   [facility]   Hunger Vital Sign    Worried About Running Out of Food in the Last Year: Never true    Ran Out of Food in the Last Year: Never true  Transportation Needs: No Transportation Needs (09/06/2023)   Received from [facility] - Transportation    In the past 12 months, has lack of transportation kept you from medical appointments or from getting medications?: No    Lack of Transportation (Non-Medical): No  Physical Activity: Not on file  Stress: Not on file  Social Connections: Not on file  Intimate Partner Violence: Not on file    Past Surgical History:  Procedure Laterality Date   ABDOMINAL HYSTERECTOMY      Family History  Problem Relation Age of Onset   Hypertension Brother    Breast cancer Neg Hx     No Known Allergies      No data to display            CMP  No results found for: "NA",  "K", "CL", "CO2", "GLUCOSE", "BUN", "CREATININE", "CALCIUM", "PROT", "ALBUMIN", "AST", "ALT", "ALKPHOS", "BILITOT", "GFR", "EGFR", "GFRNONAA"   No results found.     Assessment & Plan:   1. Atherosclerosis of native artery of both lower extremities with intermittent claudication (HCC)  Recommend:  The patient has evidence of atherosclerosis of the lower extremities with claudication.  The patient does not voice lifestyle limiting changes at this point in time.  Noninvasive studies do not suggest clinically significant change.  No invasive studies, angiography or surgery at this time The patient should continue walking and begin a more formal exercise program.  The patient should continue antiplatelet therapy and aggressive treatment of the lipid abnormalities  No changes in the patient's medications at this time  Continued surveillance is indicated as atherosclerosis is likely to progress with time.    The patient will continue follow up with noninvasive studies as ordered.   2. Essential hypertension Continue antihypertensive medications as already ordered, these medications have been reviewed and there are no changes at this time.  3. Mixed hyperlipidemia Continue statin as ordered and reviewed, no changes at this time   Current Outpatient Medications on File Prior to Visit  Medication Sig Dispense Refill   amLODipine (NORVASC) 2.5 MG tablet Take 1 tablet by mouth daily.     celecoxib (CELEBREX) 200 MG capsule Take 200 mg by mouth daily.     Cholecalciferol 50 MCG (2000 UT) TABS Take by mouth.     FLOVENT HFA 110 MCG/ACT inhaler Inhale 2 puffs into the lungs 2 (two) times daily.     fluticasone-salmeterol (ADVAIR) 100-50 MCG/ACT AEPB Inhale into the lungs.     levothyroxine (SYNTHROID) 25 MCG tablet Take 25 mcg by mouth daily.     methocarbamol (ROBAXIN) 500 MG tablet 1/2-1 po qBID prn     pregabalin (LYRICA) 50 MG capsule Take 50 mg by mouth 2 (two) times daily.      simvastatin (ZOCOR) 40 MG tablet Take 40 mg by mouth at bedtime.     traMADol (ULTRAM) 50 MG tablet Take by mouth.     urea (CARMOL) 40 % CREA Apply topically 2 (two) times daily.     valsartan-hydrochlorothiazide (DIOVAN-HCT) 320-12.5 MG tablet Take 1 tablet by mouth daily.     No current facility-administered medications on file prior to visit.    There are no Patient Instructions on file for this visit. Return in about 6 months (around  05/09/2024) for PAD.   Georgiana Spinner, NP

## 2024-03-12 ENCOUNTER — Other Ambulatory Visit: Payer: Self-pay | Admitting: Internal Medicine

## 2024-03-12 DIAGNOSIS — Z1231 Encounter for screening mammogram for malignant neoplasm of breast: Secondary | ICD-10-CM

## 2024-04-11 ENCOUNTER — Ambulatory Visit
Admission: RE | Admit: 2024-04-11 | Discharge: 2024-04-11 | Disposition: A | Discharge status: Home / Self Care | Source: Ambulatory Visit | Attending: Internal Medicine | Admitting: Internal Medicine

## 2024-04-11 DIAGNOSIS — Z1231 Encounter for screening mammogram for malignant neoplasm of breast: Secondary | ICD-10-CM | POA: Diagnosis present

## 2024-05-11 NOTE — Progress Notes (Signed)
 MRN : [id number]  Monique Franco is a 83 y.o. (05/25/1941) female who presents with chief complaint of check circulation.  History of Present Illness:  The patient returns to the office for followup and review of the noninvasive studies.    There have been no interval changes in lower extremity symptoms. No interval shortening of the patient's claudication distance or development of rest pain symptoms. No new ulcers or wounds have occurred since the last visit.  She notes that she has significant pain in the right leg but following a recent steroid injection to her knee that pain has resolved   There have been no significant changes to the patient's overall health care.   The patient denies amaurosis fugax or recent TIA symptoms. There are no documented recent neurological changes noted. There is no history of DVT, PE or superficial thrombophlebitis. The patient denies recent episodes of angina or shortness of breath.    ABI Rt=0.62 and Lt=0.47  (previous ABI's Rt=0.73 and Lt=0.65) Duplex ultrasound of the bilateral lower extremity shows biphasic/monophasic waveforms in the tibial vessels   No outpatient medications have been marked as taking for the 05/12/24 encounter (Appointment) with Jama, Cordella MATSU, MD.    Past Medical History:  Diagnosis Date   Hypertension     Past Surgical History:  Procedure Laterality Date   ABDOMINAL HYSTERECTOMY      Social History Social History   Tobacco Use   Smoking status: Former    Types: Cigarettes   Smokeless tobacco: Never  Substance Use Topics   Alcohol use: Never   Drug use: Never    Family History Family History  Problem Relation Age of Onset   Hypertension Brother    Breast cancer Neg Hx     No Known Allergies   REVIEW OF SYSTEMS (Negative unless checked)  Constitutional: [] Weight loss  [] Fever  [] Chills Cardiac: [] Chest pain   [] Chest  pressure   [] Palpitations   [] Shortness of breath when laying flat   [] Shortness of breath with exertion. Vascular:  [x] Pain in legs with walking   [] Pain in legs at rest  [] History of DVT   [] Phlebitis   [] Swelling in legs   [] Varicose veins   [] Non-healing ulcers Pulmonary:   [] Uses home oxygen   [] Productive cough   [] Hemoptysis   [] Wheeze  [] COPD   [] Asthma Neurologic:  [] Dizziness   [] Seizures   [] History of stroke   [] History of TIA  [] Aphasia   [] Vissual changes   [] Weakness or numbness in arm   [] Weakness or numbness in leg Musculoskeletal:   [] Joint swelling   [x] Joint pain   [] Low back pain Hematologic:  [] Easy bruising  [] Easy bleeding   [] Hypercoagulable state   [] Anemic Gastrointestinal:  [] Diarrhea   [] Vomiting  [] Gastroesophageal reflux/heartburn   [] Difficulty swallowing. Genitourinary:  [] Chronic kidney disease   [] Difficult urination  [] Frequent urination   [] Blood in urine Skin:  [] Rashes   [] Ulcers  Psychological:  [] History of anxiety   []  History of major depression.  Physical Examination  There were no vitals filed for this visit. There is no height or  weight on file to calculate BMI. Gen: WD/WN, NAD Head: [place]/AT, No temporalis wasting.  Ear/Nose/Throat: Hearing grossly intact, nares w/o erythema or drainage Eyes: PER, EOMI, sclera nonicteric.  Neck: Supple, no masses.  No bruit or JVD.  Pulmonary:  Good air movement, no audible wheezing, no use of accessory muscles.  Cardiac: RRR, normal S1, S2, no Murmurs. Vascular:  mild trophic changes, no open wounds Vessel Right Left  Radial Palpable Palpable  PT Not Palpable Not Palpable  DP Not Palpable Not Palpable  Gastrointestinal: soft, non-distended. No guarding/no peritoneal signs.  Musculoskeletal: M/S 5/5 throughout.  No visible deformity.  Neurologic: CN 2-12 intact. Pain and light touch intact in extremities.  Symmetrical.  Speech is fluent. Motor exam as listed above. Psychiatric: Judgment intact, Mood & affect  appropriate for pt's clinical situation. Dermatologic: No rashes or ulcers noted.  No changes consistent with cellulitis.   CBC No results found for: WBC, HGB, HCT, MCV, PLT  BMET No results found for: NA, K, CL, CO2, GLUCOSE, BUN, CREATININE, CALCIUM, GFRNONAA, GFRAA CrCl cannot be calculated (No successful lab value found.).  COAG No results found for: INR, PROTIME  Radiology No results found.   Assessment/Plan 1. Atherosclerosis of native artery of both lower extremities with intermittent claudication (Primary)  Recommend:  The patient has evidence of atherosclerosis of the lower extremities with claudication.  The patient does not voice lifestyle limiting changes at this point in time.  Noninvasive studies do not suggest clinically significant change.  No invasive studies, angiography or surgery at this time The patient should continue walking and begin a more formal exercise program.  The patient should continue antiplatelet therapy and aggressive treatment of the lipid abnormalities  No changes in the patient's medications at this time  Continued surveillance is indicated as atherosclerosis is likely to progress with time.    The patient will continue follow up with noninvasive studies as ordered.  - VAS US  ABI WITH/WO TBI; Future  2. Essential hypertension Continue antihypertensive medications as already ordered, these medications have been reviewed and there are no changes at this time.  3. Primary osteoarthritis involving multiple joints Continue medications to treat the patient's degenerative disease as already ordered, these medications have been reviewed and there are no changes at this time.  Continued activity and therapy was stressed.  4. Mixed hyperlipidemia Continue statin as ordered and reviewed, no changes at this time    Cordella Shawl, MD  05/11/2024 4:31 PM

## 2024-05-12 ENCOUNTER — Ambulatory Visit (INDEPENDENT_AMBULATORY_CARE_PROVIDER_SITE_OTHER)

## 2024-05-12 ENCOUNTER — Ambulatory Visit (INDEPENDENT_AMBULATORY_CARE_PROVIDER_SITE_OTHER): Admitting: Vascular Surgery

## 2024-05-12 ENCOUNTER — Encounter (INDEPENDENT_AMBULATORY_CARE_PROVIDER_SITE_OTHER): Payer: Self-pay | Admitting: Vascular Surgery

## 2024-05-12 VITALS — BP 159/70 | HR 48 | Ht 61.0 in | Wt 141.0 lb

## 2024-05-12 DIAGNOSIS — I10 Essential (primary) hypertension: Secondary | ICD-10-CM

## 2024-05-12 DIAGNOSIS — M150 Primary generalized (osteo)arthritis: Secondary | ICD-10-CM | POA: Diagnosis not present

## 2024-05-12 DIAGNOSIS — I70213 Atherosclerosis of native arteries of extremities with intermittent claudication, bilateral legs: Secondary | ICD-10-CM | POA: Diagnosis not present

## 2024-05-12 DIAGNOSIS — E782 Mixed hyperlipidemia: Secondary | ICD-10-CM | POA: Diagnosis not present

## 2024-05-14 LAB — VAS US ABI WITH/WO TBI
Left ABI: 0.47
Right ABI: 0.62

## 2024-05-24 ENCOUNTER — Encounter (INDEPENDENT_AMBULATORY_CARE_PROVIDER_SITE_OTHER): Payer: Self-pay | Admitting: Vascular Surgery

## 2024-11-10 ENCOUNTER — Ambulatory Visit (INDEPENDENT_AMBULATORY_CARE_PROVIDER_SITE_OTHER): Admitting: Vascular Surgery

## 2024-11-10 ENCOUNTER — Encounter (INDEPENDENT_AMBULATORY_CARE_PROVIDER_SITE_OTHER)
